# Patient Record
Sex: Male | Born: 1945 | Hispanic: Yes | Marital: Married | Smoking: Never smoker
Health system: Southern US, Community
[De-identification: ages and names within clinical notes are randomized; demographics above are authoritative.]

## PROBLEM LIST (undated history)

## (undated) DIAGNOSIS — I10 Essential (primary) hypertension: Secondary | ICD-10-CM

## (undated) DIAGNOSIS — N400 Enlarged prostate without lower urinary tract symptoms: Secondary | ICD-10-CM

---

## 2014-05-24 ENCOUNTER — Emergency Department: Payer: Self-pay | Admitting: Emergency Medicine
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
## 2016-05-03 ENCOUNTER — Ambulatory Visit: Payer: Self-pay | Admitting: Urology

## 2016-05-03 ENCOUNTER — Encounter: Payer: Self-pay | Admitting: Urology

## 2016-05-03 DIAGNOSIS — R972 Elevated prostate specific antigen [PSA]: Secondary | ICD-10-CM | POA: Insufficient documentation

## 2016-05-03 DIAGNOSIS — R3915 Urgency of urination: Secondary | ICD-10-CM | POA: Insufficient documentation

## 2016-05-03 NOTE — Progress Notes (Deleted)
   05/03/2016 10:59 AM   Jon Middleton 10/05/1945 [id number]  Referring provider: Kathrynn RunningNoah Bedford Wouk, MD [address] [place], [place] [id number]  CC: New patient with elevated PSA   HPI:  1. Elevated PSA - XXX FHX prostate cancer. PSA 6.3 noted by PCP at annual physical 02/2016. DRE XXXX.   2. Urinary urgency - on tamsulosin at baseline with good control of moderate bother from mix of obstructive and irritative sympotms.   PMH sig for HTN, Mild hypothyroid, [id number]. His PCP is Shonna ChockNoah Wouk [facility].   Today "Jon Middleton" is seen as new patient for above.     PMH: No past medical history on file.  Surgical History: No past surgical history on file.  Home Medications:  Allergies as of 05/03/2016   Not on File     Medication List    as of 05/03/2016 10:59 AM   You have not been prescribed any medications.     Allergies: Allergies not on file  Family History: No family history on file.  Social History:  has no tobacco, alcohol, and drug history on file.     Review of Systems  Gastrointestinal (upper)  : Negative for upper GI symptoms  Gastrointestinal (lower) : Negative for lower GI symptoms  Constitutional : Negative for symptoms  Skin: Negative for skin symptoms  Eyes: Negative for eye symptoms  Ear/Nose/Throat : Negative for Ear/Nose/Throat symptoms  Hematologic/Lymphatic: Negative for Hematologic/Lymphatic symptoms  Cardiovascular : Negative for cardiovascular symptoms  Respiratory : Negative for respiratory symptoms  Endocrine: Negative for endocrine symptoms  Musculoskeletal: Negative for musculoskeletal symptoms  Neurological: Negative for neurological symptoms  Psychologic: Negative for psychiatric symptoms   Physical Exam: There were no vitals taken for this visit.  Constitutional:  Alert and oriented, No acute distress. HEENT: [place] AT, moist mucus membranes.  Trachea midline, no  masses. Cardiovascular: No clubbing, cyanosis, or edema. Respiratory: Normal respiratory effort, no increased work of breathing. GI: Abdomen is soft, nontender, nondistended, no abdominal masses GU: No CVA tenderness. *** Skin: No rashes, bruises or suspicious lesions. Lymph: No cervical or inguinal adenopathy. Neurologic: Grossly intact, no focal deficits, moving all 4 extremities. Psychiatric: Normal mood and affect.  Laboratory Data: No results found for: WBC, HGB, HCT, MCV, PLT  No results found for: CREATININE  No results found for: PSA  No results found for: TESTOSTERONE  No results found for: HGBA1C  Urinalysis No results found for: COLORURINE, APPEARANCEUR, LABSPEC, PHURINE, GLUCOSEU, HGBUR, BILIRUBINUR, KETONESUR, PROTEINUR, UROBILINOGEN, NITRITE, LEUKOCYTESUR  Pertinent Imaging: none  Assessment & Plan:    1. Elevated PSA - discussed DDX benign and malignant etiology and natural history of prostate cancer. He has minimal comorbidity and excellent functional status. Management options incluidng serial labs / exam, genetic testis (very expensive, cannot diagnose cancer), or biopsy (only test that can diagnose prostate cancer) discussed.   He opts for [employer]XXXXXXX.   2. Urinary urgency - hopefully due to BPH and not cancer as per above. Agree with tamsulosin for now.    No Follow-up on file.  Sebastian AcheMANNY, Karessa Onorato, MD  [facility] [address], [address] [place], [place] [address] [phone]

## 2016-12-20 ENCOUNTER — Emergency Department
Admission: EM | Admit: 2016-12-20 | Discharge: 2016-12-20 | Disposition: A | Discharge status: Home / Self Care | Payer: BLUE CROSS/BLUE SHIELD | Attending: Emergency Medicine | Admitting: Emergency Medicine

## 2016-12-20 ENCOUNTER — Encounter: Payer: Self-pay | Admitting: Medical Oncology

## 2016-12-20 DIAGNOSIS — R339 Retention of urine, unspecified: Secondary | ICD-10-CM | POA: Insufficient documentation

## 2016-12-20 DIAGNOSIS — I10 Essential (primary) hypertension: Secondary | ICD-10-CM | POA: Diagnosis not present

## 2016-12-20 DIAGNOSIS — R338 Other retention of urine: Secondary | ICD-10-CM

## 2016-12-20 DIAGNOSIS — R1030 Lower abdominal pain, unspecified: Secondary | ICD-10-CM | POA: Diagnosis not present

## 2016-12-20 HISTORY — DX: Essential (primary) hypertension: I10

## 2016-12-20 HISTORY — DX: Benign prostatic hyperplasia without lower urinary tract symptoms: N40.0

## 2016-12-20 LAB — BASIC METABOLIC PANEL
Anion gap: 10 (ref 5–15)
BUN: 13 mg/dL (ref 6–20)
CHLORIDE: 104 mmol/L (ref 101–111)
CO2: 26 mmol/L (ref 22–32)
Calcium: 9.2 mg/dL (ref 8.9–10.3)
Creatinine, Ser: 0.89 mg/dL (ref 0.61–1.24)
Glucose, Bld: 115 mg/dL — ABNORMAL HIGH (ref 65–99)
POTASSIUM: 3.4 mmol/L — AB (ref 3.5–5.1)
SODIUM: 140 mmol/L (ref 135–145)

## 2016-12-20 LAB — URINALYSIS, COMPLETE (UACMP) WITH MICROSCOPIC
BILIRUBIN URINE: NEGATIVE
Glucose, UA: NEGATIVE mg/dL
Ketones, ur: NEGATIVE mg/dL
Leukocytes, UA: NEGATIVE
NITRITE: NEGATIVE
PH: 6 (ref 5.0–8.0)
Protein, ur: NEGATIVE mg/dL
SPECIFIC GRAVITY, URINE: 1.01 (ref 1.005–1.030)
Squamous Epithelial / LPF: NONE SEEN

## 2016-12-20 NOTE — ED Provider Notes (Signed)
[facility] Emergency Department Provider Note   ____________________________________________    I have reviewed the triage vital signs and the nursing notes.   HISTORY  Chief Complaint Urinary Retention  Spanish interpreter used   HPI Jon Middleton is a 71 y.o. male presents with difficulty urinating. Patient reports over the last 2 days he has had difficulty urinating . He reports has become progressively harder to do so. Today he felt significant pain in his lower abdomen and distention/bloating. Denies fevers or chills. No history of urinary retention the past. He does have a history of prostate hyperplasia. Denies new medications. Has not tried anything for this. Foley catheter placed in triage   Past Medical History:  Diagnosis Date  . BPH (benign prostatic hyperplasia)   . Hypertension     Patient Active Problem List   Diagnosis Date Noted  . Urinary urgency 05/03/2016  . Elevated PSA 05/03/2016    No past surgical history on file.  Prior to Admission medications   Not on File     Allergies Patient has no known allergies.  No family history on file.  Social History does not smoke does not drink  Review of Systems  Constitutional: No fever/chills Eyes: No visual changes.  ENT: No sore throat. Cardiovascular: Denies chest pain. Respiratory: Denies shortness of breath. Gastrointestinal: as above Genitourinary: as above. Musculoskeletal: Negative for back pain. Skin: Negative for rash. Neurological: Negative for headaches   ____________________________________________   PHYSICAL EXAM:  VITAL SIGNS: ED Triage Vitals [12/20/16 1028]  Enc Vitals Group     BP (!) 194/98     Pulse Rate 82     Resp 18     Temp 98.7 F (37.1 C)     Temp Source Oral     SpO2 98 %     Weight 80.7 kg (178 lb)     Height 1.753 m (5\' 9" )     Head Circumference      Peak Flow      Pain Score 10     Pain Loc      Pain Edu?    Excl. in GC?     Constitutional: Alert and oriented. No acute distress.  Eyes: Conjunctivae are normal.   Nose: No congestion/rhinnorhea. Mouth/Throat: Mucous membranes are moist.    Cardiovascular: Normal rate, regular rhythm. Grossly normal heart sounds.  Good peripheral circulation. Respiratory: Normal respiratory effort.  No retractions. Lungs CTAB. Gastrointestinal: Soft and nontender. No distention.  No CVA tenderness. Genitourinary: Foley catheter in place, yellow urine draining Musculoskeletal: No lower extremity tenderness nor edema.  Warm and well perfused Neurologic:  Normal speech and language.  Skin:  Skin is warm, dry and intact. No rash noted.  ____________________________________________   LABS (all labs ordered are listed, but only abnormal results are displayed)  Labs Reviewed  URINALYSIS, COMPLETE (UACMP) WITH MICROSCOPIC - Abnormal; Notable for the following:       Result Value   Color, Urine STRAW (*)    APPearance CLEAR (*)    Hgb urine dipstick SMALL (*)    Bacteria, UA RARE (*)    All other components within normal limits  BASIC METABOLIC PANEL - Abnormal; Notable for the following:    Potassium 3.4 (*)    Glucose, Bld 115 (*)    All other components within normal limits   ____________________________________________  EKG  None ____________________________________________  RADIOLOGY  None ____________________________________________   PROCEDURES  Procedure(s) performed: No    Critical Care  performed: No ____________________________________________   INITIAL IMPRESSION / ASSESSMENT AND PLAN / ED COURSE  Pertinent labs & imaging results that were available during my care of the patient were reviewed by me and considered in my medical decision making (see chart for details).  Patient noted some relief of lower abdominal pain with Foley catheter placement. Urinalysis is unremarkable, creatinine and BUN are normal. We will discharge  with leg bag and outpatient follow-up with urology. Answered all questions for patient and his wife.    ____________________________________________   FINAL CLINICAL IMPRESSION(S) / ED DIAGNOSES  Final diagnoses:  Acute urinary retention      NEW MEDICATIONS STARTED DURING THIS VISIT:  New Prescriptions   No medications on file     Note:  This document was prepared using Dragon voice recognition software and may include unintentional dictation errors.    Jene Every, MD 12/20/16 1450

## 2016-12-20 NOTE — ED Notes (Signed)
Education provided to pt regarding catheter care.  Pt and wife verbalized understanding.

## 2016-12-20 NOTE — ED Notes (Signed)
Pt to ed with c/o since Saturday he has been unable to void adequate amount only voids small amounts at a time. Pt c/o pain to pelvic area. Catheter in place from waiting area.

## 2016-12-20 NOTE — ED Triage Notes (Signed)
Pt reports since Saturday he has been unable to void a normal amount. Pt states that he only voids small amounts at a time. Pt c/o pain to pelvic area.

## 2016-12-20 NOTE — ED Notes (Signed)
Pt was bladder scanned with greater than of urine present. MD notified.

## 2016-12-28 ENCOUNTER — Encounter: Payer: Self-pay | Admitting: Urology

## 2016-12-28 ENCOUNTER — Ambulatory Visit (INDEPENDENT_AMBULATORY_CARE_PROVIDER_SITE_OTHER): Payer: BLUE CROSS/BLUE SHIELD | Admitting: Urology

## 2016-12-28 VITALS — BP 193/101 | HR 59 | Ht 69.0 in | Wt 180.0 lb

## 2016-12-28 DIAGNOSIS — R972 Elevated prostate specific antigen [PSA]: Secondary | ICD-10-CM

## 2016-12-28 DIAGNOSIS — R3915 Urgency of urination: Secondary | ICD-10-CM | POA: Diagnosis not present

## 2016-12-28 MED ORDER — TAMSULOSIN HCL 0.4 MG PO CAPS: 0.4000 mg | ORAL_CAPSULE | Freq: Every day | ORAL | 3 refills | Status: AC

## 2016-12-28 MED ORDER — FINASTERIDE 5 MG PO TABS: 5.0000 mg | ORAL_TABLET | Freq: Every day | ORAL | 3 refills | Status: AC

## 2016-12-28 NOTE — Addendum Note (Signed)
Addended by: Earnest ConroyHUNTER, Hubert Raatz S on: 12/28/2016 09:36 AM   Modules accepted: Orders

## 2016-12-28 NOTE — Progress Notes (Signed)
12/28/2016 6:36 AM   Jon Middleton 03/13/1946 [id number]  Referring provider: Inc, [facility] [address] [place], [place] [address]  CC: new patient urinary retention  HPI:  1. Urinary Retention - new acute retention 11/2016 managed by ER catheter for >1L urine. Cr <1. UA normal. Started on no new meds. DRE 12/2016 80gm+ smooth. NO h/o GU surgery.   2. Elevated PSA - h/o elevated PSA per report, no labs for review. No FHX prostate cancer. DRE 12/2016 80gm smooth.   PMH sig for HTN, parkinsonism. He is Spanis speaking only.   Today "Jon Middleton" is seen as new patient for above.     PMH: Past Medical History:  Diagnosis Date  . BPH (benign prostatic hyperplasia)   . Hypertension     Surgical History: No past surgical history on file.  Home Medications:  Allergies as of 12/28/2016   No Known Allergies     Medication List    as of 12/28/2016  6:36 AM   You have not been prescribed any medications.     Allergies: No Known Allergies  Family History: No family history on file.  Social History:  has no tobacco, alcohol, and drug history on file.    Review of Systems  Gastrointestinal (upper)  : Negative for upper GI symptoms  Gastrointestinal (lower) : Negative for lower GI symptoms  Constitutional : Negative for symptoms  Skin: Negative for skin symptoms  Eyes: Negative for eye symptoms  Ear/Nose/Throat : Negative for Ear/Nose/Throat symptoms  Hematologic/Lymphatic: Negative for Hematologic/Lymphatic symptoms  Cardiovascular : Negative for cardiovascular symptoms  Respiratory : Negative for respiratory symptoms  Endocrine: Negative for endocrine symptoms  Musculoskeletal: Negative for musculoskeletal symptoms  Neurological: Negative for neurological symptoms  Psychologic: Negative for psychiatric symptoms   Physical Exam: There were no vitals taken for this visit.  Constitutional:  Alert and oriented, No acute  distress. HEENT: [place] AT, moist mucus membranes.  Trachea midline, no masses. Cardiovascular: No clubbing, cyanosis, or edema. Respiratory: Normal respiratory effort, no increased work of breathing. GI: Abdomen is soft, nontender, nondistended, no abdominal masses GU: No CVA tenderness. Catheter in place in Circ'd phallus. DRe 80gm smooth.  Skin: No rashes, bruises or suspicious lesions. Lymph: No cervical or inguinal adenopathy. Neurologic: Grossly intact, no focal deficits, moving all 4 extremities. Psychiatric: Normal mood and affect.  Laboratory Data: No results found for: WBC, HGB, HCT, MCV, PLT  Lab Results  Component Value Date   CREATININE 0.89 12/20/2016    No results found for: PSA  No results found for: TESTOSTERONE  No results found for: HGBA1C  Urinalysis    Component Value Date/Time   COLORURINE STRAW (A) 12/20/2016 1040   APPEARANCEUR CLEAR (A) 12/20/2016 1040   LABSPEC 1.010 12/20/2016 1040   PHURINE 6.0 12/20/2016 1040   GLUCOSEU NEGATIVE 12/20/2016 1040   HGBUR SMALL (A) 12/20/2016 1040   BILIRUBINUR NEGATIVE 12/20/2016 1040   KETONESUR NEGATIVE 12/20/2016 1040   PROTEINUR NEGATIVE 12/20/2016 1040   NITRITE NEGATIVE 12/20/2016 1040   LEUKOCYTESUR NEGATIVE 12/20/2016 1040     Assessment & Plan:   1. Urinary Retention - likely from enlarged prostate by exam and history. Rec begin tamsulosin + finasteride and trial of void in 2 weeks. If fail, then 2 more week med trial, If fails again, will need cysto and urodynamics.   2. Elevated PSA -  PSA today to r/o extreme elevation.   Sebastian AcheMANNY, Caidynce Muzyka, MD  [facility] [address], [address]  [place], [place] [address] [phone]

## 2016-12-28 NOTE — Addendum Note (Signed)
Addended by: Honor LohGARRISON, CARRIE M on: 12/28/2016 09:27 AM   Modules accepted: Orders

## 2016-12-29 LAB — PSA: Prostate Specific Ag, Serum: 9.2 ng/mL — ABNORMAL HIGH (ref 0.0–4.0)

## 2017-01-11 ENCOUNTER — Ambulatory Visit (INDEPENDENT_AMBULATORY_CARE_PROVIDER_SITE_OTHER): Payer: BLUE CROSS/BLUE SHIELD | Admitting: Urology

## 2017-01-11 ENCOUNTER — Encounter: Payer: Self-pay | Admitting: Urology

## 2017-01-11 VITALS — BP 207/97 | HR 57 | Ht 69.0 in | Wt 180.0 lb

## 2017-01-11 DIAGNOSIS — R3915 Urgency of urination: Secondary | ICD-10-CM

## 2017-01-11 DIAGNOSIS — R339 Retention of urine, unspecified: Secondary | ICD-10-CM | POA: Diagnosis not present

## 2017-01-11 LAB — BLADDER SCAN AMB NON-IMAGING: Scan Result: 480

## 2017-01-11 NOTE — Progress Notes (Signed)
01/11/2017 2:21 PM   Jon Middleton 21-May-1945 [id number]  Referring provider: Inc, [facility] [address] [place], [place] [address]  Chief Complaint  Patient presents with  . Urinary Retention    2 wk VT    HPI: Dr Berneice Heinrich 1. Urinary Retention - new acute retention 11/2016 managed by ER catheter for >1L urine. Cr <1. UA normal. Started on no new meds. DRE 12/2016 80gm+ smooth. NO h/o GU surgery.   2. Elevated PSA - h/o elevated PSA per report, no labs for review. No FHX prostate cancer. DRE 12/2016 80gm smooth.   PMH sig for HTN, parkinsonism. He is Spanis speaking only.  Today Approximate 2 weeks ago the patient was started on tamsulosin and finasteride and was here for a trial of voiding. His PSA ordered 2 weeks ago was 9.2 Foley catheter in place and well tolerated. Clinically noninfected. Patient is compliant with medication.  Later in the afternoon the patient came back and had voided twice a small amount. He was scanned for about 460 mL  Catheter was reinserted     PMH: Past Medical History:  Diagnosis Date  . BPH (benign prostatic hyperplasia)   . Hypertension     Surgical History: No past surgical history on file.  Home Medications:  Allergies as of 01/11/2017   No Known Allergies     Medication List       Accurate as of 01/11/17  2:21 PM. Always use your most recent med list.          finasteride 5 MG tablet Commonly known as:  PROSCAR Take 1 tablet (5 mg total) by mouth daily.   tamsulosin 0.4 MG Caps capsule Commonly known as:  FLOMAX Take 1 capsule (0.4 mg total) by mouth daily.            Discharge Care Instructions        Start     Ordered   01/11/17 0000  Bladder Scan (Post Void Residual) in office     01/11/17 1407      Allergies: No Known Allergies  Family History: Family History  Problem Relation Age of Onset  . Bladder Cancer Neg Hx   . Prostate cancer Neg Hx   . Kidney cancer Neg Hx     Social  History:  reports that he has never smoked. He has never used smokeless tobacco. He reports that he does not drink alcohol or use drugs.  ROS: UROLOGY Frequent Urination?: No Hard to postpone urination?: No Burning/pain with urination?: No Get up at night to urinate?: No Leakage of urine?: No Urine stream starts and stops?: No Trouble starting stream?: No Do you have to strain to urinate?: No Blood in urine?: No Urinary tract infection?: No Sexually transmitted disease?: No Injury to kidneys or bladder?: No Painful intercourse?: No Weak stream?: No Erection problems?: No Penile pain?: Yes  Gastrointestinal Nausea?: No Vomiting?: No Indigestion/heartburn?: No Diarrhea?: No Constipation?: No  Constitutional Fever: No Night sweats?: No Weight loss?: No Fatigue?: No  Skin Skin rash/lesions?: No Itching?: No  Eyes Blurred vision?: No Double vision?: No  Ears/Nose/Throat Sore throat?: No Sinus problems?: No  Hematologic/Lymphatic Swollen glands?: No Easy bruising?: No  Cardiovascular Leg swelling?: No Chest pain?: No  Respiratory Cough?: No Shortness of breath?: No  Endocrine Excessive thirst?: No  Musculoskeletal Back pain?: Yes Joint pain?: No  Neurological Headaches?: No Dizziness?: No  Psychologic Depression?: No Anxiety?: No  Physical Exam: BP (!) 207/97   Pulse Marland Kitchen)  57   Ht  (1.753 m)   Wt 180 lb (81.6 kg)   BMI 26.58 kg/m   Constitutional:  Alert and oriented, No acute distress.   Laboratory Data: No results found for: WBC, HGB, HCT, MCV, PLT  Lab Results  Component Value Date   CREATININE 0.89 12/20/2016    No results found for: PSA  No results found for: TESTOSTERONE  No results found for: HGBA1C  Urinalysis    Component Value Date/Time   COLORURINE STRAW (A) 12/20/2016 1040   APPEARANCEUR CLEAR (A) 12/20/2016 1040   LABSPEC 1.010 12/20/2016 1040   PHURINE 6.0 12/20/2016 1040   GLUCOSEU NEGATIVE 12/20/2016  1040   HGBUR SMALL (A) 12/20/2016 1040   BILIRUBINUR NEGATIVE 12/20/2016 1040   KETONESUR NEGATIVE 12/20/2016 1040   PROTEINUR NEGATIVE 12/20/2016 1040   NITRITE NEGATIVE 12/20/2016 1040   LEUKOCYTESUR NEGATIVE 12/20/2016 1040    Pertinent Imaging: none  Assessment & Plan: The patient has an enlarged prostate, and elevated PSA and retention. I recommended for him to have a second trial of voiding on 0.8 mg of Flomax and cystoscopy in the next week or so. If he continues to do poorly he should have urodynamics in my opinion. Steward Drone at [facility] if asked generally will do these quite quickly so the patient does not have a catheter in for a lengthy period of time. If he continues to have retention it will need to be interface with his elevated PSA. He has not had a recent renal ultrasound or CT scan for silent hydronephrosis. This could the a chronic presentation  There are no diagnoses linked to this encounter.  No Follow-up on file.  Martina Sinner, MD  [facility] [address] [place], [place] [address] [phone]

## 2017-01-11 NOTE — Progress Notes (Signed)
Patient returned. He voided twice w/in 10 minutes of waiting to be c/b for bladder scan.  Bladder Scan Patient cannot void: 480 ml Performed By: C. Rana Snare, CMA  Simple Catheter Placement  Due to urinary retention patient is present today for a foley cath placement.  Patient was cleaned and prepped in a sterile fashion with betadine and lidocaine jelly 2% was instilled into the urethra.  A 16 Coude foley catheter was inserted, urine return was noted  , urine was yellow in color.  The balloon was filled with 10cc of sterile water.  A leg bag was attached for drainage. Patient was also given a night bag to take home and was given instruction on how to change from one bag to another.  Patient was given instruction on proper catheter care.  Patient tolerated well, no complications were noted. Pt was instructed to increase tamsulosin 0.4 to 2 capsules daily. Patient verbalized understanding per interpreter.  Preformed by: C.Rana Snare, CMA  Additional notes/ Follow up: 1 week for Cysto

## 2017-01-17 ENCOUNTER — Ambulatory Visit: Payer: BLUE CROSS/BLUE SHIELD | Admitting: Urology

## 2017-01-17 ENCOUNTER — Encounter: Payer: Self-pay | Admitting: Urology

## 2017-01-17 VITALS — BP 192/99 | HR 60 | Ht 69.0 in | Wt 179.7 lb

## 2017-01-17 DIAGNOSIS — N138 Other obstructive and reflux uropathy: Secondary | ICD-10-CM

## 2017-01-17 DIAGNOSIS — R3915 Urgency of urination: Secondary | ICD-10-CM

## 2017-01-17 DIAGNOSIS — R339 Retention of urine, unspecified: Secondary | ICD-10-CM

## 2017-01-17 DIAGNOSIS — N401 Enlarged prostate with lower urinary tract symptoms: Secondary | ICD-10-CM

## 2017-01-17 DIAGNOSIS — R972 Elevated prostate specific antigen [PSA]: Secondary | ICD-10-CM

## 2017-01-17 LAB — URINALYSIS, COMPLETE
BILIRUBIN UA: NEGATIVE
GLUCOSE, UA: NEGATIVE
Ketones, UA: NEGATIVE
NITRITE UA: NEGATIVE
PROTEIN UA: NEGATIVE
Specific Gravity, UA: 1.02 (ref 1.005–1.030)
UUROB: 0.2 mg/dL (ref 0.2–1.0)
pH, UA: 5.5 (ref 5.0–7.5)

## 2017-01-17 LAB — MICROSCOPIC EXAMINATION: Epithelial Cells (non renal): NONE SEEN /hpf (ref 0–10)

## 2017-01-17 MED ORDER — CIPROFLOXACIN HCL 500 MG PO TABS: 500.0000 mg | ORAL_TABLET | Freq: Two times a day (BID) | ORAL | 0 refills | Status: DC

## 2017-01-17 MED ORDER — CIPROFLOXACIN HCL 500 MG PO TABS
500.0000 mg | ORAL_TABLET | Freq: Once | ORAL | Status: AC
  Administered 2017-01-17: 500 mg via ORAL

## 2017-01-17 MED ORDER — LIDOCAINE HCL 2 % EX GEL
1.0000 "application " | Freq: Once | CUTANEOUS | Status: AC
  Administered 2017-01-17: 1 via URETHRAL

## 2017-01-17 NOTE — Progress Notes (Signed)
Continuous Intermittent Catheterization  Due to incomplete bladder emptying and bph w/ obstruction patient is present today for a teaching of self I & O Catheterization. Patient was given detailed verbal and printed instructions of self catheterization. Patient was cleaned and prepped in a sterile fashion.  With instruction and assistance patient inserted a 14FR and urine return was noted 100 ml, urine was light pink in color. Patient tolerated well, no complications were noted Patient was given a sample bag with supplies to take home.  Instructions were given per Dr. EskridgMena Goes patient to cath 4 times daily.  An order was placed with [facility] for catheters to be sent to the patient's home. Patient is to follow up in 6 weeks.  Preformed by: C. Rana Snare, CMA

## 2017-01-17 NOTE — Progress Notes (Signed)
01/17/2017 1:54 PM   Jon Middleton 14-Mar-1946 [id number]  Referring provider: Inc, [facility] [address] [place], [place] [address]  No chief complaint on file.   HPI:  F/u for cystoscopy/void trial. Also to discuss management of retention and elevated PSA.   1. Urinary Retention - new acute retention 11/2016 managed by ER catheter for >1L urine. Cr <1. UA normal. No h/o GU surgery. Failed void trial Jan 11, 2017. He returned and PVR was 460 ml. A foley was replaced.   2. BPH - began tamsulosin and finasteride 12/28/2016. DRE 12/2016 80gm smooth.  3. Elevated PSA - h/o elevated PSA per report, no labs for review. No FHX prostate cancer. DRE 12/2016 80gm smooth. PSA 9.2 Dec 28, 2016.   PMH sig for HTN, parkinsonism. He is Spanish speaking only.   PMH: Past Medical History:  Diagnosis Date  . BPH (benign prostatic hyperplasia)   . Hypertension     Surgical History: No past surgical history on file.  Home Medications:  Allergies as of 01/17/2017   No Known Allergies     Medication List       Accurate as of 01/17/17  1:54 PM. Always use your most recent med list.          ciprofloxacin 500 MG tablet Commonly known as:  CIPRO Take 1 tablet (500 mg total) by mouth every 12 (twelve) hours.   finasteride 5 MG tablet Commonly known as:  PROSCAR Take 1 tablet (5 mg total) by mouth daily.   tamsulosin 0.4 MG Caps capsule Commonly known as:  FLOMAX Take 1 capsule (0.4 mg total) by mouth daily.            Discharge Care Instructions        Start     Ordered   01/17/17 1345  LIDOCAINE HCL 2 % EX GEL   Once     01/17/17 1332   01/17/17 0000  ciprofloxacin (CIPRO) 500 MG tablet  Every 12 hours     01/17/17 1332   01/17/17 0000  Urinalysis, Complete     01/17/17 1332      Allergies: No Known Allergies  Family History: Family History  Problem Relation Age of Onset  . Bladder Cancer Neg Hx   . Prostate cancer Neg Hx   . Kidney cancer Neg Hx      Social History:  reports that he has never smoked. He has never used smokeless tobacco. He reports that he does not drink alcohol or use drugs.  ROS:                                        Physical Exam: BP (!) 192/99 (BP Location: Left Arm, Patient Position: Sitting, Cuff Size: Normal)   Pulse 60   Ht  (1.753 m)   Wt 81.5 kg (179 lb 11.2 oz)   BMI 26.54 kg/m   Constitutional:  Alert and oriented, No acute distress. HEENT: [place] AT, moist mucus membranes.  Trachea midline, no masses. Cardiovascular: No clubbing, cyanosis, or edema. Respiratory: Normal respiratory effort, no increased work of breathing. GI: Abdomen is soft, nontender, nondistended, no abdominal masses GU: No CVA tenderness.  Penis circumcised and normal. Meatus normal.  Skin: No rashes, bruises or suspicious lesions. Lymph: No cervical or inguinal adenopathy. Neurologic: Grossly intact, no focal deficits, moving all 4 extremities. Psychiatric: Normal mood and affect.  Cystoscopy Procedure Note  Patient identification was confirmed, informed consent was obtained. Foley removed. Patient was prepped using Betadine solution.  Lidocaine jelly was administered per urethral meatus.    Preoperative abx where received prior to procedure.    Pre-Procedure: - Inspection reveals a normal caliber ureteral meatus.  Procedure: The flexible cystoscope was introduced without difficulty - No urethral strictures/lesions are present. - moderate lateral lobe hypertrophy and a small median lobe - visually obstructing  - bladder neck elevated  - Bilateral ureteral orifices identified - Bladder mucosa reveals no ulcers, tumors, or lesions - No bladder stones - moderate trabeculation  Retroflexion shows prostate protrusion about 1 cm, otherwise normal bladder neck.   He was filled to about 300 ml. He voided only about 50 ml.    Post-Procedure: - Patient tolerated the procedure  well   Assessment & Plan:    1. BPH with LUTS, Urinary urgency - continue tamsulosin and finasteride. He could undergo TURP or prostate vaporization based on the cystoscopic findings (prostate not too large).  - ciprofloxacin (CIPRO) 500 MG tablet; Take 1 tablet (500 mg total) by mouth every 12 (twelve) hours.  Dispense: 14 tablet; Refill: 0 - lidocaine (XYLOCAINE) 2 % jelly 1 application; Place 1 application into the urethra once. - Urinalysis, Complete  2. Incomplete bladder emptying - Void trial today equivocal. Discussed some options - surveillance, replace foley, learn CIC. They were taught CIC. Start QID.  - ciprofloxacin (CIPRO) 500 MG tablet; Take 1 tablet (500 mg total) by mouth every 12 (twelve) hours.  Dispense: 14 tablet; Refill: 0 - lidocaine (XYLOCAINE) 2 % jelly 1 application; Place 1 application into the urethra once. - Urinalysis, Complete  3. Elevated PSA - recheck PSA in 6 weeks. Discussed PSA elevation - benign and malignant causes. He's had a foley in and out and a normal DRE. Will monitor closely. - ciprofloxacin (CIPRO) 500 MG tablet; Take 1 tablet (500 mg total) by mouth every 12 (twelve) hours.  Dispense: 14 tablet; Refill: 0 - lidocaine (XYLOCAINE) 2 % jelly 1 application; Place 1 application into the urethra once. - Urinalysis, Complete   No Follow-up on file.  Jerilee Field, MD  [facility] [address] [place], [place] [address] [phone]

## 2017-01-17 NOTE — Addendum Note (Signed)
Addended by: Ellin Goodie on: 01/17/2017 04:46 PM   Modules accepted: Orders

## 2017-01-18 MED ORDER — CIPROFLOXACIN HCL 500 MG PO TABS: 500.0000 mg | ORAL_TABLET | Freq: Two times a day (BID) | ORAL | 0 refills | Status: AC

## 2017-01-18 NOTE — Addendum Note (Signed)
Addended by: Honor Loh on: 01/18/2017 02:43 PM   Modules accepted: Orders

## 2017-03-10 ENCOUNTER — Encounter: Payer: Self-pay | Admitting: Urology

## 2017-03-10 ENCOUNTER — Other Ambulatory Visit: Payer: BLUE CROSS/BLUE SHIELD

## 2017-03-14 ENCOUNTER — Encounter: Payer: Self-pay | Admitting: Urology

## 2017-03-14 ENCOUNTER — Ambulatory Visit: Payer: BLUE CROSS/BLUE SHIELD | Admitting: Urology

## 2019-02-07 ENCOUNTER — Other Ambulatory Visit: Payer: Self-pay | Admitting: Physician Assistant

## 2019-02-07 DIAGNOSIS — H4921 Sixth [abducent] nerve palsy, right eye: Secondary | ICD-10-CM

## 2019-02-20 ENCOUNTER — Ambulatory Visit: Admission: RE | Admit: 2019-02-20 | Payer: BC Managed Care – PPO | Source: Ambulatory Visit
